# Patient Record
Sex: Female | Born: 1975 | Race: White | Hispanic: No | Marital: Married | Smoking: Never smoker
Health system: Southern US, Community
[De-identification: ages and names within clinical notes are randomized; demographics above are authoritative.]

## PROBLEM LIST (undated history)

## (undated) DIAGNOSIS — E039 Hypothyroidism, unspecified: Secondary | ICD-10-CM

## (undated) DIAGNOSIS — Q615 Medullary cystic kidney: Secondary | ICD-10-CM

## (undated) DIAGNOSIS — E119 Type 2 diabetes mellitus without complications: Secondary | ICD-10-CM

## (undated) DIAGNOSIS — I10 Essential (primary) hypertension: Secondary | ICD-10-CM

## (undated) HISTORY — PX: DILATION AND CURETTAGE, DIAGNOSTIC / THERAPEUTIC: SUR384

## (undated) HISTORY — PX: LEG SURGERY: SHX1003

## (undated) HISTORY — PX: RETINAL DETACHMENT SURGERY: SHX105

---

## 2005-04-18 ENCOUNTER — Ambulatory Visit: Payer: Self-pay | Admitting: Family Medicine

## 2005-05-18 ENCOUNTER — Ambulatory Visit: Payer: Self-pay | Admitting: Family Medicine

## 2005-07-01 ENCOUNTER — Ambulatory Visit: Payer: Self-pay | Admitting: Family Medicine

## 2005-07-15 ENCOUNTER — Ambulatory Visit: Payer: Self-pay | Admitting: Family Medicine

## 2005-07-29 ENCOUNTER — Ambulatory Visit: Payer: Self-pay | Admitting: Family Medicine

## 2005-08-23 ENCOUNTER — Ambulatory Visit: Payer: Self-pay | Admitting: Family Medicine

## 2005-09-09 ENCOUNTER — Ambulatory Visit: Payer: Self-pay | Admitting: Family Medicine

## 2005-09-19 ENCOUNTER — Ambulatory Visit: Payer: Self-pay | Admitting: Family Medicine

## 2005-12-28 ENCOUNTER — Ambulatory Visit: Payer: Self-pay | Admitting: Family Medicine

## 2006-03-28 DIAGNOSIS — I10 Essential (primary) hypertension: Secondary | ICD-10-CM

## 2006-03-28 DIAGNOSIS — M25569 Pain in unspecified knee: Secondary | ICD-10-CM

## 2006-03-28 DIAGNOSIS — E039 Hypothyroidism, unspecified: Secondary | ICD-10-CM | POA: Insufficient documentation

## 2006-03-28 DIAGNOSIS — F17200 Nicotine dependence, unspecified, uncomplicated: Secondary | ICD-10-CM

## 2006-03-28 DIAGNOSIS — G43909 Migraine, unspecified, not intractable, without status migrainosus: Secondary | ICD-10-CM | POA: Insufficient documentation

## 2006-03-28 DIAGNOSIS — E669 Obesity, unspecified: Secondary | ICD-10-CM

## 2006-08-28 ENCOUNTER — Telehealth: Payer: Self-pay | Admitting: Family Medicine

## 2009-01-28 ENCOUNTER — Ambulatory Visit (HOSPITAL_COMMUNITY): Admission: RE | Admit: 2009-01-28 | Discharge: 2009-01-28 | Payer: Self-pay | Admitting: Ophthalmology

## 2010-09-25 LAB — CBC
HCT: 39.9 % (ref 36.0–46.0)
Platelets: 401 10*3/uL — ABNORMAL HIGH (ref 150–400)
RDW: 12.7 % (ref 11.5–15.5)
WBC: 12.8 10*3/uL — ABNORMAL HIGH (ref 4.0–10.5)

## 2010-09-25 LAB — COMPREHENSIVE METABOLIC PANEL
AST: 20 U/L (ref 0–37)
Albumin: 3.9 g/dL (ref 3.5–5.2)
Alkaline Phosphatase: 64 U/L (ref 39–117)
BUN: 8 mg/dL (ref 6–23)
Chloride: 104 mEq/L (ref 96–112)
Creatinine, Ser: 0.78 mg/dL (ref 0.4–1.2)
GFR calc Af Amer: >60 mL/min (ref >60)
Potassium: 3.6 mEq/L (ref 3.5–5.1)
Total Protein: 7 g/dL (ref 6.0–8.3)

## 2010-09-25 LAB — URINALYSIS, ROUTINE W REFLEX MICROSCOPIC
Bilirubin Urine: NEGATIVE
Glucose, UA: NEGATIVE mg/dL
Hgb urine dipstick: NEGATIVE
Specific Gravity, Urine: 1.016 (ref 1.005–1.030)

## 2010-11-02 NOTE — Op Note (Signed)
NAMEANDREY, HOOBLER               ACCOUNT NO.:  [id number]   MEDICAL RECORD NO.:  [id number]          PATIENT TYPE:  AMB   LOCATION:  SDS                          FACILITY:  [facility]   PHYSICIAN:  Lanna Poche, M.D. 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   PROCEDURES:  Scleral buckle with drainage of subretinal fluid and laser  photocoagulation, right eye.   SURGEON:  Lanna Poche, MD   ANESTHESIA:  General endotracheal.   ESTIMATED BLOOD LOSS:  Less than 1 mL.   COMPLICATIONS:  None.   OPERATIVE NOTE:  The patient was taken to the operating room where after  induction of general anesthesia, the right eye was prepped and draped in  usual fashion.  Lid speculum was introduced and conjunctival peritomy  was developed at the limbus 360 degrees with relaxing incisions at 3 and  9.  Hemostasis was not necessary, and the four rectus muscles were  isolated and placed on traction sutures of 2-0 silk.  The four quadrants  were inspected and found to be free of abnormally dense sclera.  Inspection with an indirect ophthalmoscope revealed there to be a  chronic inferior retinal detachment extending from approximately 4  o'clock through 8 o'clock.  There was a solitary atrophic hole just  immediately posterior to the ora serrata and area of lattice  degeneration.  Laser photocoagulation was then applied over the detached  retina as well as scleral depression around the retinal breaks seen  approximately 6:30 to 7.  Good retinal whitening was achieved around the  break.  Attention was then directed back to the eye where lid speculum  was removed.  Traction sutures of 4-0 silk were placed in  the upper and  lower lid.  A 240 band was passed around the eyelid with the ends joined  to the superonasal quadrant 270 sleeve.  A partial length 277 buckle was  placed under the lateral, inferior, and medial rectus muscles.  Mersilene sutures of 4-0 size to each quadrant in mattress fashion were  passed in the inferior quadrants.  Mersilene 5-0 was used to anchor the  band superiorly.  Inspection internally showed there to be good  placement of the buckle with good support over the area of the retinal  break.  __________ fluid being predominantly inferonasally.  Site for  drainage was selected approximately at 5 o'clock.  The buckle was  retracted out of the bed and an incision down to the sclera made to the  edges and to the choroid.  The choroid was visible and dark blue in  color.  Several Vicryl sutures were passed through the scar opening and  the endolaser probe was used into the subretinal space.  Thickened  gelatinous fluid was seen to be draining.  After fluid slowdown, these 7-  0  Vicryl suture was tied up.  The sutures on the inferior quadrants were  tightened.  The inspection with ophthalmoscope showed good drainage of  the majority of the subretinal fluid from the retinal __________ the  buckle.  The indirect laser was then used to perform scattered  photocoagulation over the buckle __________ fashion.  The knots were  then firmly tied and rotated posteriorly.  The buckle ends were trimmed  superonasally.  The band was brought to appropriate tension and then the  excess band trimmed and then 5-0 Mersilene sutures secured and rotated  posteriorly.  The conjunctiva was then drawn and reapproximated with  running sutures of 6-0 plain gut after the traction sutures had removed  and the lid speculum reintroduced.  Subconjunctival space was irrigated  with a mixed antibiotic solution followed by 0.75% Marcaine.  The  pressure was checked prior to the irrigation and found to lay at  21.  The subconjunctival space was then injected with 100 mg of ceftazidime  and 10 mg of Decadron.  The lid speculum was then removed and mixed  antibiotic ointment applied to the surface of the eye.  Eye patch and  shield was then placed over the patient's right eye.  The patient  tolerated the procedure well.           ______________________________  Lanna Poche, M.D.     JTH/MEDQ  D:  01/28/2009  T:  01/28/2009  Job:  [id number]

## 2011-08-26 DIAGNOSIS — E039 Hypothyroidism, unspecified: Secondary | ICD-10-CM | POA: Insufficient documentation

## 2013-04-29 DIAGNOSIS — E65 Localized adiposity: Secondary | ICD-10-CM | POA: Insufficient documentation

## 2013-07-19 DIAGNOSIS — IMO0002 Reserved for concepts with insufficient information to code with codable children: Secondary | ICD-10-CM | POA: Insufficient documentation

## 2013-07-19 DIAGNOSIS — N181 Chronic kidney disease, stage 1: Secondary | ICD-10-CM | POA: Insufficient documentation

## 2013-07-19 DIAGNOSIS — E1029 Type 1 diabetes mellitus with other diabetic kidney complication: Secondary | ICD-10-CM | POA: Insufficient documentation

## 2015-05-01 DIAGNOSIS — E109 Type 1 diabetes mellitus without complications: Secondary | ICD-10-CM | POA: Insufficient documentation

## 2016-09-14 ENCOUNTER — Other Ambulatory Visit: Payer: Self-pay | Admitting: Occupational Medicine

## 2016-09-14 ENCOUNTER — Ambulatory Visit: Payer: Self-pay

## 2016-09-14 DIAGNOSIS — Z0000 Encounter for general adult medical examination without abnormal findings: Secondary | ICD-10-CM

## 2017-10-28 IMAGING — CR DG CHEST 1V
1 series · 1 of 1 positions shown · non-contrast
Comparison: 01/27/2009.

ADDENDUM:
The third sentence of the findings section should read:

"MILD ELEVATION OF THE RIGHT HEMIDIAPHRAGM, NORMAL VARIANT."
This was discussed by telephone with Dr. Erwin Andres on 09/14/2016 at
[DATE] .
CLINICAL DATA: 40-year-old female undergoing pre-employment
physical exam.
EXAM:
CHEST 1 VIEW

[view not recorded]
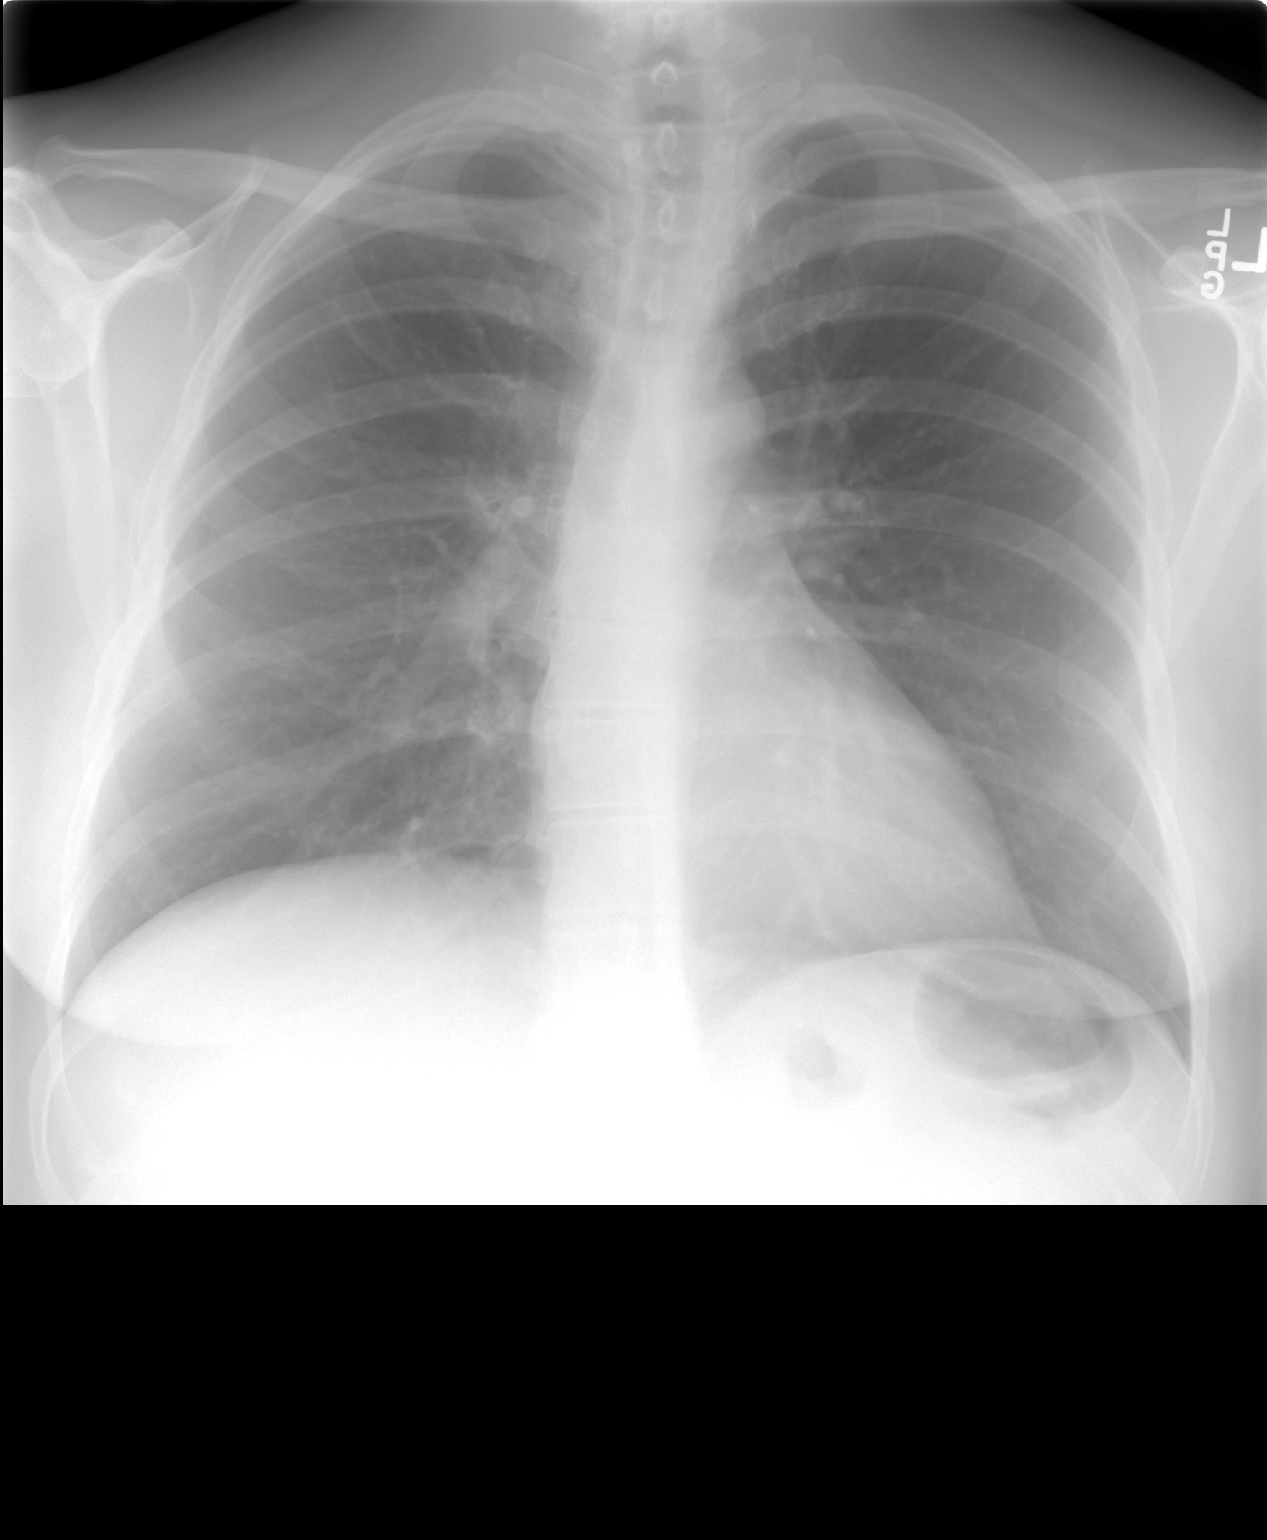

[1 of 1 positions shown; findings below may reference images not displayed]

FINDINGS: PA view of the chest. Lung volumes are stable and within normal
limits. Mild elevation of the right hot growth that mild elevation
of the right hemidiaphragm common normal variant. Normal cardiac
size and mediastinal contours. Visualized tracheal air column is
within normal limits. The lungs are clear. No pneumothorax or
pleural effusion. Mild to moderate thoracic scoliosis. No acute
osseous abnormality identified.
IMPRESSION: No acute cardiopulmonary abnormality.

## 2018-02-08 DIAGNOSIS — E785 Hyperlipidemia, unspecified: Secondary | ICD-10-CM | POA: Insufficient documentation

## 2018-02-08 DIAGNOSIS — E538 Deficiency of other specified B group vitamins: Secondary | ICD-10-CM | POA: Insufficient documentation

## 2018-08-18 ENCOUNTER — Other Ambulatory Visit: Payer: Self-pay

## 2018-08-18 ENCOUNTER — Emergency Department
Admission: EM | Admit: 2018-08-18 | Discharge: 2018-08-18 | Disposition: A | Discharge status: Home / Self Care | Payer: Managed Care, Other (non HMO) | Source: Home / Self Care | Attending: Family Medicine | Admitting: Family Medicine

## 2018-08-18 DIAGNOSIS — J9801 Acute bronchospasm: Secondary | ICD-10-CM | POA: Diagnosis not present

## 2018-08-18 DIAGNOSIS — B9789 Other viral agents as the cause of diseases classified elsewhere: Secondary | ICD-10-CM | POA: Diagnosis not present

## 2018-08-18 DIAGNOSIS — J069 Acute upper respiratory infection, unspecified: Secondary | ICD-10-CM | POA: Diagnosis not present

## 2018-08-18 MED ORDER — DOXYCYCLINE HYCLATE 100 MG PO CAPS: 100.0000 mg | ORAL_CAPSULE | Freq: Two times a day (BID) | ORAL | 0 refills | Status: AC

## 2018-08-18 MED ORDER — ALBUTEROL SULFATE 108 (90 BASE) MCG/ACT IN AEPB: 2.0000 | INHALATION_SPRAY | RESPIRATORY_TRACT | 0 refills | Status: AC | PRN

## 2018-08-18 MED ORDER — BENZONATATE 200 MG PO CAPS: ORAL_CAPSULE | ORAL | 0 refills | Status: AC

## 2018-08-18 NOTE — Discharge Instructions (Addendum)
Take plain guaifenesin (1200mg  extended release tabs such as Mucinex) twice daily, with plenty of water, for cough and congestion.  May continue Pseudoephedrine (30mg , one or two every 4 to 6 hours) for sinus congestion if blood pressure is not affected.  Get adequate rest.   May use Afrin nasal spray (or generic oxymetazoline) each morning for about 5 days and then discontinue.  Also recommend using saline nasal spray several times daily and saline nasal irrigation (AYR is a common brand).  Use Flonase nasal spray each morning after using Afrin nasal spray and saline nasal irrigation. Try warm salt water gargles for sore throat.  Stop all antihistamines for now, and other non-prescription cough/cold preparations. May take Delsym Cough Suppressant with Tessalon at bedtime for nighttime cough.

## 2018-08-18 NOTE — ED Triage Notes (Addendum)
Pt c/o cold sxs x 1 week, with post nasal drip and sore throat. Worsened in last few days. Hacking cough, yellow mucous. Hx of bronchitis. Taking OTC cold meds prn.

## 2018-08-18 NOTE — ED Provider Notes (Signed)
[facility]    CSN: [id number] Arrival date & time: 08/18/18  0941     History   Chief Complaint Chief Complaint  Patient presents with  . Cough    Post nasal drip  . Sore Throat    HPI Barbara Gilmore is a 43 y.o. female.   Patient reports that she has had a sore throat and sinus congestion for about a week.  Three days ago she developed a partly productive cough with yellow sputum.  She has developed occasional wheezing and low grade fever during the past two days. She has a past history of exercise asthma, perennial rhinitis, and pneumonia about 15 years ago.  She has a family history of asthma (father and brother).     History reviewed. No pertinent past medical history.  Patient Active Problem List   Diagnosis Date Noted  . HYPOTHYROIDISM, UNSPECIFIED 03/28/2006  . OBESITY, NOS 03/28/2006  . TOBACCO DEPENDENCE 03/28/2006  . MIGRAINE, UNSPEC., W/O INTRACTABLE MIGRAINE 03/28/2006  . HYPERTENSION, BENIGN SYSTEMIC 03/28/2006  . 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 No pertinent surgical history.  OB History   No obstetric history on file.      Home Medications    Prior to Admission medications   Medication Sig Start Date End Date Taking? Authorizing Provider  Albuterol Sulfate (PROAIR RESPICLICK) 108 (90 Base) MCG/ACT AEPB Inhale 2 puffs into the lungs every 4 (four) hours as needed. 08/18/18   Lattie Haw, MD  benzonatate (TESSALON) 200 MG capsule Take one cap by mouth at bedtime as needed for cough.  May repeat in 4 to 6 hours 08/18/18   Lattie Haw, MD  doxycycline (VIBRAMYCIN) 100 MG capsule Take 1 capsule (100 mg total) by mouth 2 (two) times daily. Take with food. 08/18/18   Lattie Haw, MD    Family History History reviewed. No pertinent family history.  Social History Social History   Tobacco Use  . Smoking status: Never Smoker  . Smokeless tobacco: Never Used  Substance Use Topics  . Alcohol  use: Never    Frequency: Never  . Drug use: Never     Allergies   Erythromycin   Review of Systems Review of Systems + sore throat + cough No pleuritic pain + wheezing + nasal congestion + post-nasal drainage No sinus pain/pressure No itchy/red eyes No earache No hemoptysis No SOB + fever, + chills No nausea No vomiting No abdominal pain No diarrhea No urinary symptoms No skin rash + fatigue No myalgias + headache Used OTC meds without relief   Physical Exam Triage Vital Signs ED Triage Vitals  Enc Vitals Group     BP 08/18/18 1017 (!) 157/94     Pulse Rate 08/18/18 1017 92     Resp 08/18/18 1017 18     Temp 08/18/18 1017 97.7 F (36.5 C)     Temp Source 08/18/18 1017 Oral     SpO2 08/18/18 1017 98 %     Weight 08/18/18 1018 211 lb (95.7 kg)     Height 08/18/18 1018 5\' 6"  (1.676 m)     Head Circumference --      Peak Flow --      Pain Score 08/18/18 1018 0     Pain Loc --      Pain Edu? --      Excl. in GC? --    No data found.  Updated Vital Signs BP (!) 157/94 (BP Location: Right  Arm)   Pulse 92   Temp 97.7 F (36.5 C) (Oral)   Resp 18   Ht 5\' 6"  (1.676 m)   Wt 95.7 kg   LMP  (LMP Unknown)   SpO2 98%   BMI 34.06 kg/m   Visual Acuity Right Eye Distance:   Left Eye Distance:   Bilateral Distance:    Right Eye Near:   Left Eye Near:    Bilateral Near:     Physical Exam Nursing notes and Vital Signs reviewed. Appearance:  Patient appears stated age, and in no acute distress Eyes:  Pupils are equal, round, and reactive to light and accomodation.  Extraocular movement is intact.  Conjunctivae are not inflamed  Ears:  Canals normal.  Tympanic membranes normal.  Nose:  Mildly congested turbinates.  No sinus tenderness. Pharynx:  Normal Neck:  Supple.  Enlarged posterior/lateral nodes are palpated bilaterally, tender to palpation on the left.   Lungs:  Faint wheezes left posterior chest; no rales.  Breath sounds are equal.  Moving air  well. Heart:  Regular rate and rhythm without murmurs, rubs, or gallops.  Abdomen:  Nontender without masses or hepatosplenomegaly.  Bowel sounds are present.  No CVA or flank tenderness.  Extremities:  No edema.  Skin:  No rash present.    UC Treatments / Results  Labs (all labs ordered are listed, but only abnormal results are displayed) Labs Reviewed - No data to display  EKG None  Radiology No results found.  Procedures Procedures (including critical care time)  Medications Ordered in UC Medications - No data to display  Initial Impression / Assessment and Plan / UC Course  I have reviewed the triage vital signs and the nursing notes.  Pertinent labs & imaging results that were available during my care of the patient were reviewed by me and considered in my medical decision making (see chart for details).    Because of her past history of pneumonia and reactive airways disease, will begin doxycycline.  Prescription written for Benzonatate [facility]) to take at bedtime for night-time cough.  Refill albuterol inhaler. Followup with Family Doctor if not improved in about 10 days.   Final Clinical Impressions(s) / UC Diagnoses   Final diagnoses:  Viral URI with cough  Bronchospasm, acute     Discharge Instructions     Take plain guaifenesin (1200mg  extended release tabs such as Mucinex) twice daily, with plenty of water, for cough and congestion.  May continue Pseudoephedrine (30mg , one or two every 4 to 6 hours) for sinus congestion if blood pressure is not affected.  Get adequate rest.   May use Afrin nasal spray (or generic oxymetazoline) each morning for about 5 days and then discontinue.  Also recommend using saline nasal spray several times daily and saline nasal irrigation (AYR is a common brand).  Use Flonase nasal spray each morning after using Afrin nasal spray and saline nasal irrigation. Try warm salt water gargles for sore throat.  Stop all antihistamines  for now, and other non-prescription cough/cold preparations. May take Delsym Cough Suppressant with Tessalon at bedtime for nighttime cough.       ED Prescriptions    Medication Sig Dispense Auth. Provider   doxycycline (VIBRAMYCIN) 100 MG capsule Take 1 capsule (100 mg total) by mouth 2 (two) times daily. Take with food. 20 capsule Lattie Haw, MD   benzonatate (TESSALON) 200 MG capsule Take one cap by mouth at bedtime as needed for cough.  May repeat in 4  to 6 hours 15 capsule Lattie Haw, MD   Albuterol Sulfate (PROAIR RESPICLICK) 108 (90 Base) MCG/ACT AEPB Inhale 2 puffs into the lungs every 4 (four) hours as needed. 1 each Lattie Haw, MD        Lattie Haw, MD 08/30/18 [phone]

## 2020-06-22 ENCOUNTER — Other Ambulatory Visit: Payer: Self-pay

## 2020-06-22 ENCOUNTER — Emergency Department (INDEPENDENT_AMBULATORY_CARE_PROVIDER_SITE_OTHER)
Admission: EM | Admit: 2020-06-22 | Discharge: 2020-06-22 | Disposition: A | Discharge status: Home / Self Care | Payer: Managed Care, Other (non HMO) | Source: Home / Self Care

## 2020-06-22 DIAGNOSIS — Z87442 Personal history of urinary calculi: Secondary | ICD-10-CM

## 2020-06-22 DIAGNOSIS — E119 Type 2 diabetes mellitus without complications: Secondary | ICD-10-CM | POA: Insufficient documentation

## 2020-06-22 DIAGNOSIS — R3129 Other microscopic hematuria: Secondary | ICD-10-CM

## 2020-06-22 DIAGNOSIS — R300 Dysuria: Secondary | ICD-10-CM

## 2020-06-22 DIAGNOSIS — N181 Chronic kidney disease, stage 1: Secondary | ICD-10-CM

## 2020-06-22 HISTORY — DX: Type 2 diabetes mellitus without complications: E11.9

## 2020-06-22 HISTORY — DX: Hypothyroidism, unspecified: E03.9

## 2020-06-22 HISTORY — DX: Medullary cystic kidney: Q61.5

## 2020-06-22 HISTORY — DX: Essential (primary) hypertension: I10

## 2020-06-22 LAB — POCT URINALYSIS DIP (MANUAL ENTRY)
Bilirubin, UA: NEGATIVE
Glucose, UA: NEGATIVE mg/dL
Ketones, POC UA: NEGATIVE mg/dL
Leukocytes, UA: NEGATIVE
Nitrite, UA: NEGATIVE
Protein Ur, POC: NEGATIVE mg/dL
Spec Grav, UA: 1.025 (ref 1.010–1.025)
Urobilinogen, UA: 0.2 E.U./dL
pH, UA: 5.5 (ref 5.0–8.0)

## 2020-06-22 LAB — POCT CBC W AUTO DIFF (K'VILLE URGENT CARE)

## 2020-06-22 MED ORDER — KETOROLAC TROMETHAMINE 60 MG/2ML IM SOLN
60.0000 mg | Freq: Once | INTRAMUSCULAR | Status: AC
  Administered 2020-06-22: 60 mg via INTRAMUSCULAR

## 2020-06-22 MED ORDER — OXYCODONE-ACETAMINOPHEN 5-325 MG PO TABS: 1.0000 | ORAL_TABLET | Freq: Four times a day (QID) | ORAL | 0 refills | Status: AC | PRN

## 2020-06-22 NOTE — ED Provider Notes (Signed)
[facility]    CSN: [id number] Arrival date & time: 06/22/20  1334      History   Chief Complaint Chief Complaint  Patient presents with  . Dysuria    HPI Barbara Gilmore is a 45 y.o. female.   HPI Barbara Gilmore is a 45 y.o. female presenting to UC with hx of medullary sponge kidney and kidney stones c/o 2 days of Right lower back pain that has radiated into suprapubic region with dysuria, urinary frequency and urgency.  She has taken tylenol, ibuprofen and previously prescribed flomax with mild relief. Intermittent nausea she believes from the pain. Hx of kidney stone about 1 year ago, which passed on its own. Symptoms feel similar. Denies fever, chills, vomiting or diarrhea.    Past Medical History:  Diagnosis Date  . Diabetes ([place])   . Hypertension   . Hypothyroid   . Medullary sponge kidney     Patient Active Problem List   Diagnosis Date Noted  . Diabetes mellitus ([place]) 06/22/2020  . Dyslipidemia 02/08/2018  . Vitamin B12 deficiency 02/08/2018  . 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 CKD (chronic kidney disease) stage 1, GFR 90 ml/min or greater 07/19/2013  . Type 1 diabetes, uncontrolled, with renal manifestation ([place]) 07/19/2013  . 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 Hypothyroidism 08/26/2011  . HYPOTHYROIDISM, UNSPECIFIED 03/28/2006  . OBESITY, NOS 03/28/2006  . TOBACCO DEPENDENCE 03/28/2006  . MIGRAINE, UNSPEC., W/O INTRACTABLE MIGRAINE 03/28/2006  . HYPERTENSION, BENIGN SYSTEMIC 03/28/2006  . 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 Migraine headache 03/28/2006    Past Surgical History:  Procedure Laterality Date  . DILATION AND CURETTAGE, DIAGNOSTIC / THERAPEUTIC    . LEG SURGERY     prosthetic titanium rod   . RETINAL DETACHMENT SURGERY      OB History   No obstetric history on file.      Home Medications    Prior to Admission medications   Medication Sig Start Date End Date Taking? Authorizing Provider  benazepril  (LOTENSIN) 10 MG tablet Take 12.5 mg by mouth daily.   Yes [provider]  buPROPion (WELLBUTRIN) 100 MG tablet Take 450 mg by mouth 2 (two) times daily.   Yes [provider]  hydrochlorothiazide (HYDRODIURIL) 25 MG tablet Take 20 mg by mouth daily.   Yes [provider]  levothyroxine (SYNTHROID) 175 MCG tablet Take 175 mcg by mouth daily before breakfast.   Yes [provider]  metFORMIN (GLUCOPHAGE) 1000 MG tablet Take 1,000 mg by mouth 2 (two) times daily with a meal.   Yes [provider]  oxyCODONE-acetaminophen (PERCOCET/ROXICET) 5-325 MG tablet Take 1 tablet by mouth every 6 (six) hours as needed for severe pain. 06/22/20  Yes Chimere Klingensmith O, PA-C  Albuterol Sulfate (PROAIR RESPICLICK) 893 (90 Base) MCG/ACT AEPB Inhale 2 puffs into the lungs every 4 (four) hours as needed. 08/18/18   Kandra Nicolas, MD  benzonatate (TESSALON) 200 MG capsule Take one cap by mouth at bedtime as needed for cough.  May repeat in 4 to 6 hours 08/18/18   Kandra Nicolas, MD  doxycycline (VIBRAMYCIN) 100 MG capsule Take 1 capsule (100 mg total) by mouth 2 (two) times daily. Take with food. 08/18/18   Kandra Nicolas, MD    Family History Family History  Problem Relation Age of Onset  . Thyroid disease Mother   . Diabetes Mother   . Hypertension Mother   . Scoliosis Mother   . Diabetes Father   .  Heart failure Father     Social History Social History   Tobacco Use  . Smoking status: Never Smoker  . Smokeless tobacco: Never Used  Vaping Use  . Vaping Use: Never used  Substance Use Topics  . Alcohol use: Never  . Drug use: Never     Allergies   Codeine, Hydrocodone, Morphine, and Erythromycin   Review of Systems Review of Systems  Constitutional: Negative for chills and fever.  Gastrointestinal: Positive for abdominal pain and nausea. Negative for diarrhea and vomiting.  Genitourinary: Positive for decreased urine volume, dysuria, flank pain  (right), frequency and urgency. Negative for hematuria, vaginal bleeding, vaginal discharge and vaginal pain.  Musculoskeletal: Positive for back pain. Negative for myalgias.     Physical Exam Triage Vital Signs ED Triage Vitals  Enc Vitals Group     BP 06/22/20 1357 (!) 152/87     Pulse Rate 06/22/20 1357 84     Resp 06/22/20 1357 18     Temp 06/22/20 1357 98.5 F (36.9 C)     Temp Source 06/22/20 1357 Oral     SpO2 06/22/20 1357 97 %     Weight 06/22/20 1349 210 lb (95.3 kg)     Height 06/22/20 1349 5\' 6"  (1.676 m)     Head Circumference --      Peak Flow --      Pain Score 06/22/20 1349 8     Pain Loc --      Pain Edu? --      Excl. in GC? --    No data found.  Updated Vital Signs BP (!) 152/87 (BP Location: Right Arm)   Pulse 84   Temp 98.5 F (36.9 C) (Oral)   Resp 18   Ht 5\' 6"  (1.676 m)   Wt 210 lb (95.3 kg)   LMP 06/08/2020 (Approximate)   SpO2 97%   BMI 33.89 kg/m   Visual Acuity Right Eye Distance:   Left Eye Distance:   Bilateral Distance:    Right Eye Near:   Left Eye Near:    Bilateral Near:     Physical Exam Vitals and nursing note reviewed.  Constitutional:      General: She is not in acute distress.    Appearance: Normal appearance. She is well-developed and well-nourished. She is not ill-appearing, toxic-appearing or diaphoretic.  HENT:     Head: Normocephalic and atraumatic.  Eyes:     Extraocular Movements: EOM normal.  Cardiovascular:     Rate and Rhythm: Normal rate and regular rhythm.  Pulmonary:     Effort: Pulmonary effort is normal. No respiratory distress.     Breath sounds: Normal breath sounds.  Abdominal:     General: There is no distension.     Palpations: Abdomen is soft. There is no mass.     Tenderness: There is abdominal tenderness (suprapubic). There is right CVA tenderness. There is no left CVA tenderness, guarding or rebound.     Hernia: No hernia is present.  Musculoskeletal:        General: Normal range of  motion.     Cervical back: Normal range of motion.  Skin:    General: Skin is warm and dry.  Neurological:     Mental Status: She is alert and oriented to person, place, and time.  Psychiatric:        Mood and Affect: Mood and affect normal.        Behavior: Behavior normal.  UC Treatments / Results  Labs (all labs ordered are listed, but only abnormal results are displayed) Labs Reviewed  POCT URINALYSIS DIP (MANUAL ENTRY) - Abnormal; Notable for the following components:      Result Value   Blood, UA trace-intact (*)    All other components within normal limits  POCT CBC W AUTO DIFF ([facility]) - Abnormal  COMPLETE METABOLIC PANEL WITH GFR    EKG   Radiology No results found.  Procedures Procedures (including critical care time)  Medications Ordered in UC Medications  ketorolac (TORADOL) injection 60 mg (60 mg Intramuscular Given 06/22/20 1440)    Initial Impression / Assessment and Plan / UC Course  I have reviewed the triage vital signs and the nursing notes.  Pertinent labs & imaging results that were available during my care of the patient were reviewed by me and considered in my medical decision making (see chart for details).     UA and hx c/w kidney stone Encouraged symptomatic tx and close f/u with her urologist Discussed symptoms that warrant emergent care in the ED. AVS given  Final Clinical Impressions(s) / UC Diagnoses   Final diagnoses:  CKD (chronic kidney disease) stage 1, GFR 90 ml/min or greater  History of kidney stones  Dysuria  Hematuria, microscopic     Discharge Instructions     Percocet (oxycodone-acetaminophen) is a narcotic pain medication, do not combine these medications with others containing tylenol. While taking, do not drink alcohol, drive, or perform any other activities that requires focus while taking these medications.   Call to schedule a follow up appointment with your urologist for further evaluation  and treatment of symptoms.      ED Prescriptions    Medication Sig Dispense Auth. Provider   oxyCODONE-acetaminophen (PERCOCET/ROXICET) 5-325 MG tablet Take 1 tablet by mouth every 6 (six) hours as needed for severe pain. 8 tablet Lurene Shadow, [place]     I have reviewed the PDMP during this encounter.   Lurene Shadow, [place] 06/22/20 1538

## 2020-06-22 NOTE — Discharge Instructions (Signed)
Percocet (oxycodone-acetaminophen) is a narcotic pain medication, do not combine these medications with others containing tylenol. While taking, do not drink alcohol, drive, or perform any other activities that requires focus while taking these medications.   Call to schedule a follow up appointment with your urologist for further evaluation and treatment of symptoms.

## 2020-06-22 NOTE — ED Triage Notes (Signed)
Pt presents with dysuria, lower abdominal pressure and pain radiating to RL back, increased urinary frequency. Symptoms onset 06/20/2020. OTC tylenol, ibuprofen and previously prescribed flomax.

## 2020-06-23 LAB — COMPLETE METABOLIC PANEL WITH GFR
AG Ratio: 1.5 (calc) (ref 1.0–2.5)
ALT: 17 U/L (ref 6–29)
AST: 11 U/L (ref 10–35)
Albumin: 4.3 g/dL (ref 3.6–5.1)
Alkaline phosphatase (APISO): 57 U/L (ref 31–125)
BUN: 18 mg/dL (ref 7–25)
CO2: 25 mmol/L (ref 20–32)
Calcium: 9.5 mg/dL (ref 8.6–10.2)
Chloride: 101 mmol/L (ref 98–110)
Creat: 0.77 mg/dL (ref 0.50–1.10)
GFR, Est African American: 108 mL/min/{1.73_m2} (ref >=60)
GFR, Est Non African American: 93 mL/min/{1.73_m2} (ref >=60)
Globulin: 2.8 g/dL (calc) (ref 1.9–3.7)
Glucose, Bld: 190 mg/dL — ABNORMAL HIGH (ref 65–99)
Potassium: 3.9 mmol/L (ref 3.5–5.3)
Sodium: 135 mmol/L (ref 135–146)
Total Bilirubin: 0.3 mg/dL (ref 0.2–1.2)
Total Protein: 7.1 g/dL (ref 6.1–8.1)
